# Patient Record
Sex: Female | Born: 2000 | Race: Asian | Hispanic: No | Marital: Single | Smoking: Never smoker
Health system: Southern US, Community
[De-identification: ages and names within clinical notes are randomized; demographics above are authoritative.]

---
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
## 2022-05-19 IMAGING — MR MR LUMBAR SPINE W/O CM
5 series · 30 of 48 positions shown · non-contrast
Comparison: No pertinent prior exams available for comparison.

CLINICAL DATA: Low back pain, cauda equina syndrome suspected.
Additional history provided: Bilateral leg pain, left leg worse than
right for 4 days.

EXAM:
MRI LUMBAR SPINE WITHOUT CONTRAST
TECHNIQUE: Multiplanar, multisequence MR imaging of the lumbar spine was
performed. No intravenous contrast was administered.

[Series 5: T2 · sagittal · 4.0mm · 0.81mm/px · 6 of 17 slices shown (1 of 2)]
[im 1/17]
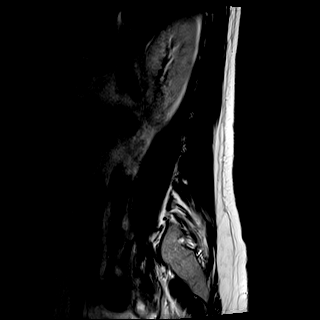
[im 4/17]
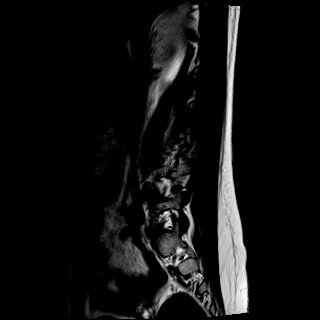
[im 7/17]
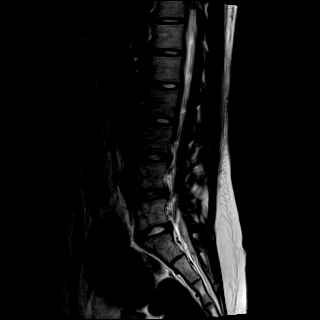
[im 10/17]
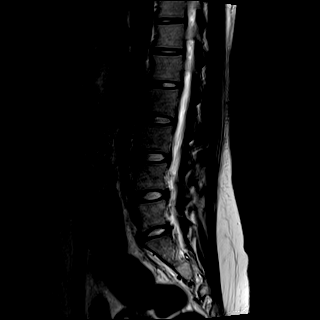
[im 13/17]
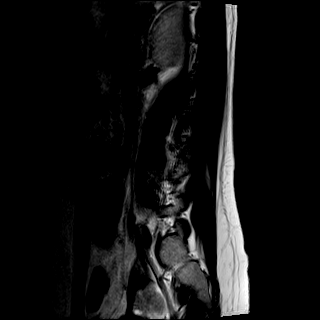
[im 17/17]
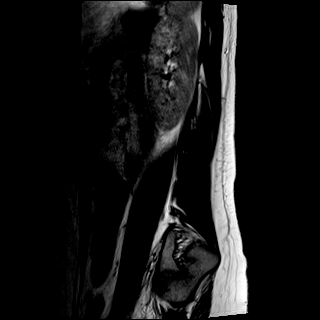

[Series 6: T1 · sagittal · 4.0mm · 0.81mm/px · 7 of 17 slices shown (1 of 2)]
[im 1/17]
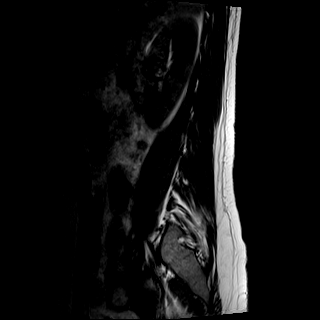
[im 3/17]
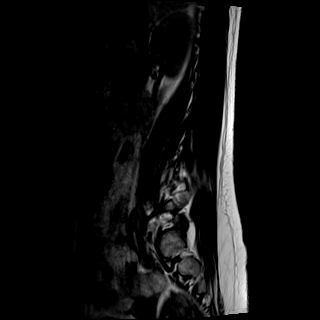
[im 6/17]
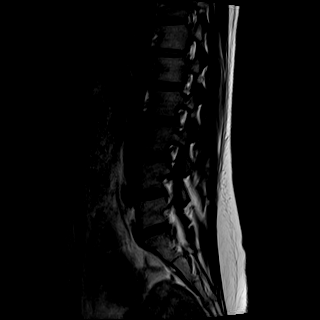
[im 9/17]
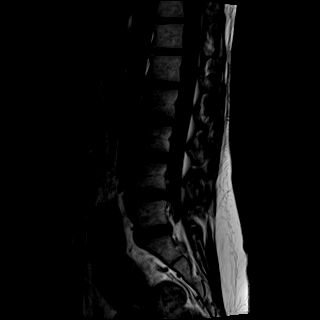
[im 11/17]
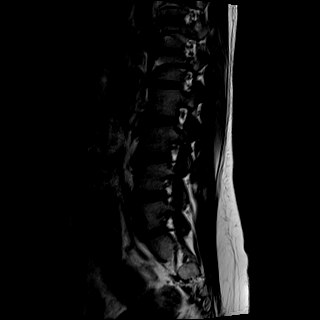
[im 14/17]
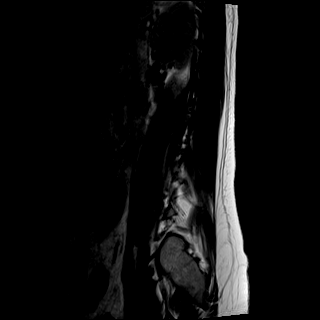
[im 17/17]
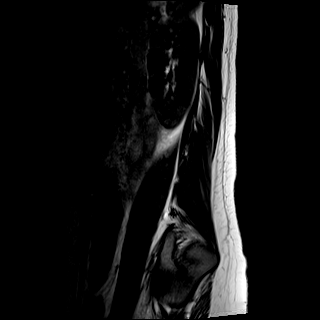

[Series 7: STIR · sagittal · 4.0mm · 0.41mm/px · 1 of 17 slices shown]
[im 1/17]
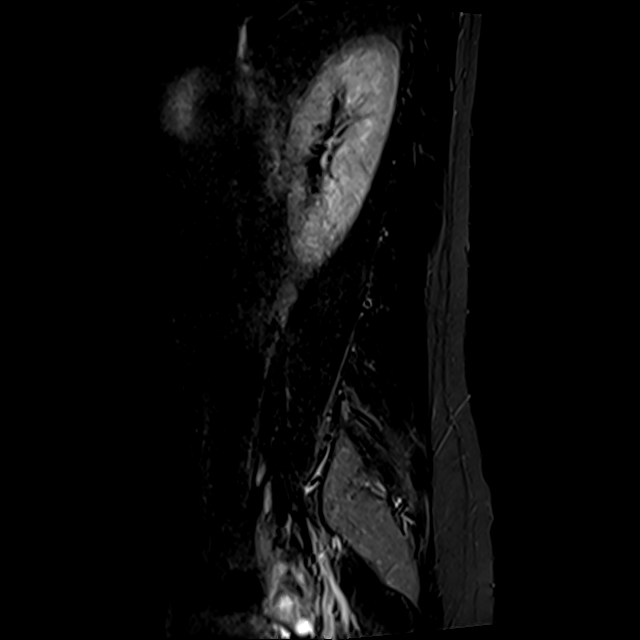

[Series 8: T2 · axial · 4.0mm · 0.78mm/px · z∈[-125,+71]mm · 8 of 34 slices shown (2 of 2)]
[im 1/34]
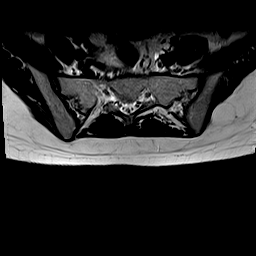
[im 6/34]
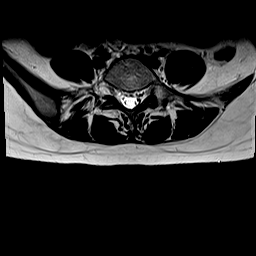
[im 11/34]
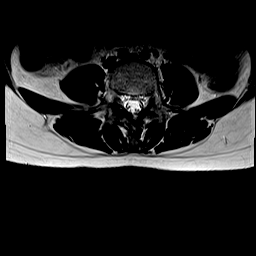
[im 16/34]
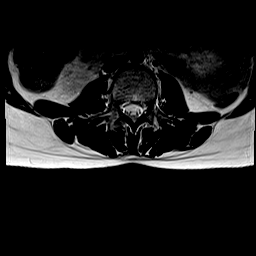
[im 18/34]
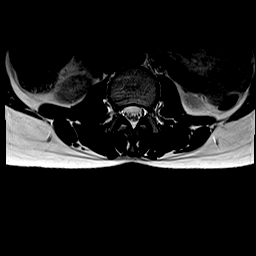
[im 23/34]
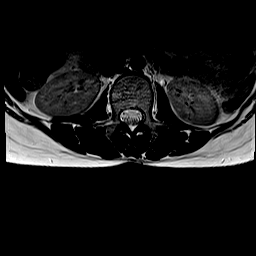
[im 28/34]
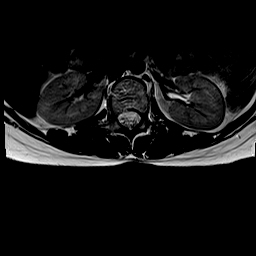
[im 34/34]
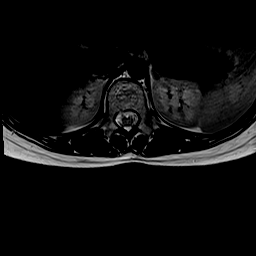

[Series 9: T1 · axial · 4.0mm · 0.39mm/px · z∈[-125,+71]mm · 8 of 34 slices shown (2 of 2)]
[im 1/34]
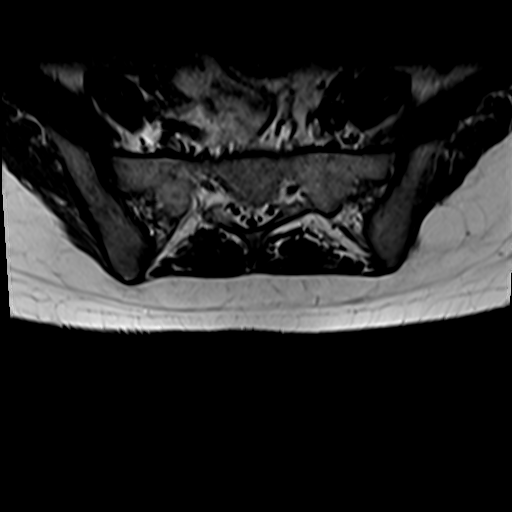
[im 6/34]
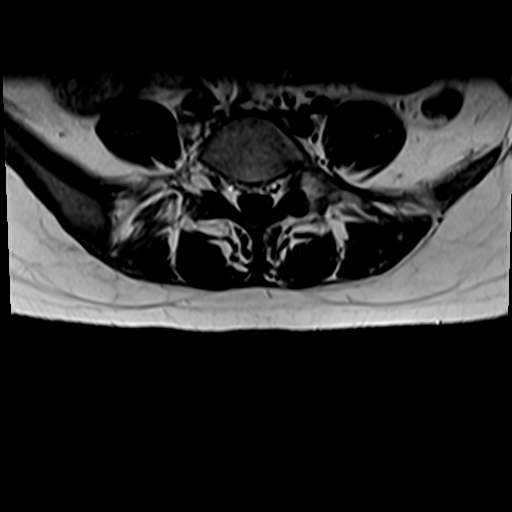
[im 11/34]
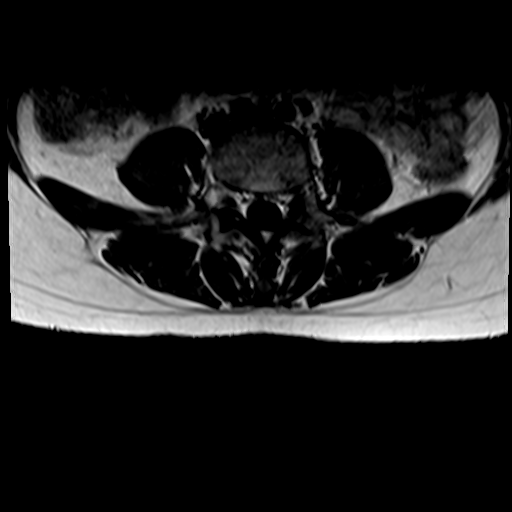
[im 16/34]
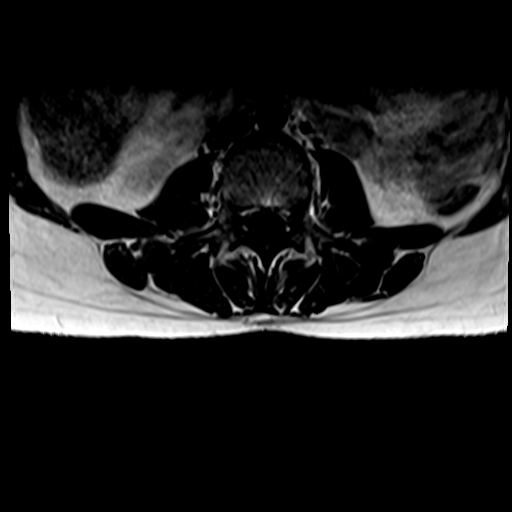
[im 18/34]
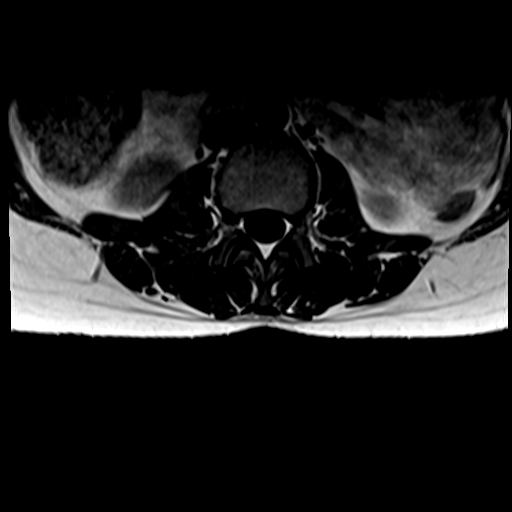
[im 23/34]
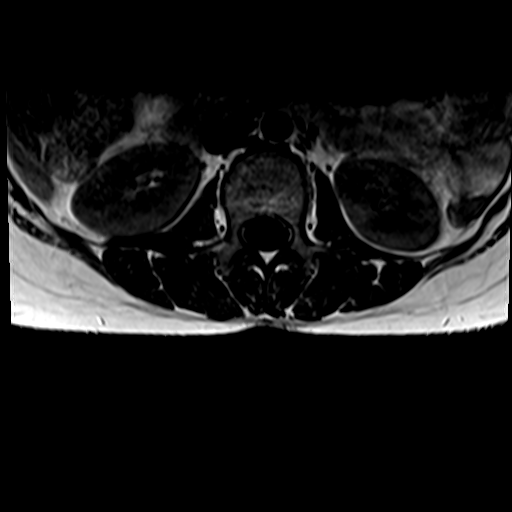
[im 28/34]
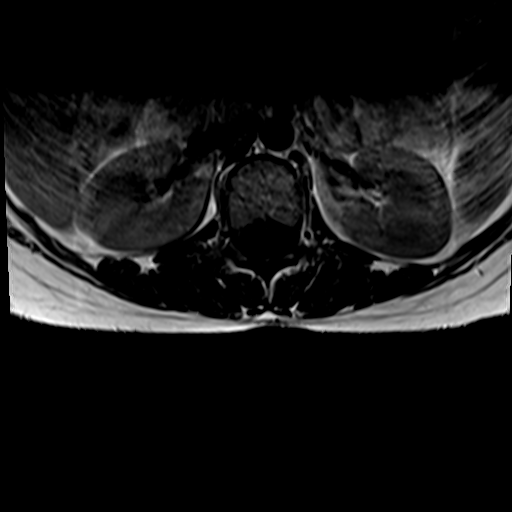
[im 34/34]
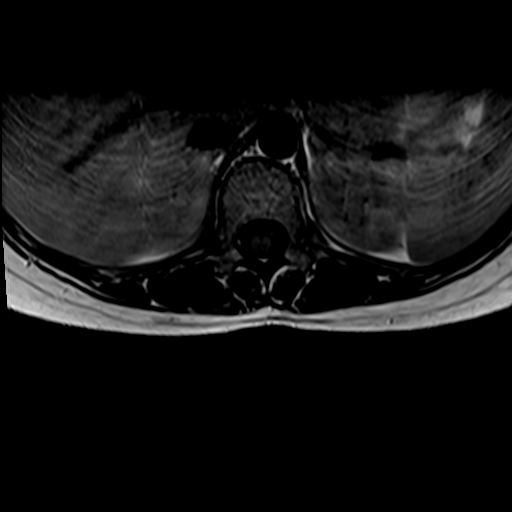

[30 of 48 positions shown; findings below may reference images not displayed]

FINDINGS: Mild intermittent motion degradation.

Segmentation: 5 lumbar vertebrae are assumed and the caudal most
well-formed intervertebral disc space is designated L5-S1.

Alignment: Straightening of the expected lumbar lordosis. No
significant spondylolisthesis.

Vertebrae: Vertebral body height is maintained. 4 mm ovoid T2/STIR
hyperintense and T1 hypointense focus along the ventral aspect of
the right L5-S1 facet joint.

Conus medullaris and cauda equina: Conus extends to the L1 level. No
signal abnormality within the visualized distal spinal cord.

Paraspinal and other soft tissues: No abnormality identified within
included portions of the abdomen/retroperitoneum. Paraspinal soft
tissues within normal limits.

Disc levels:

Intervertebral disc height and hydration are maintained throughout
the lumbar and visualized lower thoracic spine.

T11-T12: Imaged sagittally. No significant disc herniation or
stenosis.

T12-L1: No significant disc herniation or stenosis.

L1-L2: No significant disc herniation or stenosis.

L2-L3: No significant disc herniation or stenosis.

L3-L4: No significant disc herniation or stenosis.

L4-L5: Minimal disc bulge and facet arthrosis. No significant spinal
canal or foraminal stenosis.

L5-S1: Minimal disc bulge and facet arthrosis. 4 mm ovoid T2/STIR
hyperintense and T1 hypointense focus along the ventral aspect of
the right L5-S1 facet joint (series 7, image 4). No significant
spinal canal or foraminal stenosis.
IMPRESSION: Minimal L4-L5 and L5-S1 spondylosis, as described. No significant
spinal canal or foraminal stenosis within the lumbar spine.

4 mm ovoid T2/STIR hyperintense and T1 hypointense focus along the
ventral aspect of the right L5-S1 facet joint. This finding is
nonspecific, but may reflect an indeterminate osseous lesion or tiny
ventrally projecting synovial facet cyst. 4-6 month MRI follow-up is
recommended to monitor this finding and ensure stability.
